# Patient Record
Sex: Female | Born: 1970 | Race: Black or African American | Hispanic: No | Marital: Married | Smoking: Never smoker
Health system: Southern US, Community
[De-identification: ages and names within clinical notes are randomized; demographics above are authoritative.]

## PROBLEM LIST (undated history)

## (undated) DIAGNOSIS — G43909 Migraine, unspecified, not intractable, without status migrainosus: Secondary | ICD-10-CM

## (undated) DIAGNOSIS — I10 Essential (primary) hypertension: Secondary | ICD-10-CM

---

## 1998-02-13 ENCOUNTER — Other Ambulatory Visit: Admission: RE | Admit: 1998-02-13 | Discharge: 1998-02-13 | Payer: Self-pay | Admitting: Obstetrics and Gynecology

## 1999-05-06 ENCOUNTER — Other Ambulatory Visit: Admission: RE | Admit: 1999-05-06 | Discharge: 1999-05-06 | Payer: Self-pay | Admitting: Obstetrics and Gynecology

## 2001-01-07 ENCOUNTER — Other Ambulatory Visit: Admission: RE | Admit: 2001-01-07 | Discharge: 2001-01-07 | Payer: Self-pay | Admitting: Obstetrics and Gynecology

## 2004-07-21 ENCOUNTER — Inpatient Hospital Stay (HOSPITAL_COMMUNITY): Admission: AD | Admit: 2004-07-21 | Discharge: 2004-08-29 | Payer: Self-pay | Admitting: Obstetrics and Gynecology

## 2004-07-23 ENCOUNTER — Encounter: Payer: Self-pay | Admitting: Obstetrics and Gynecology

## 2004-09-12 ENCOUNTER — Observation Stay (HOSPITAL_COMMUNITY): Admission: AD | Admit: 2004-09-12 | Discharge: 2004-09-13 | Payer: Self-pay | Admitting: Obstetrics and Gynecology

## 2004-09-15 ENCOUNTER — Inpatient Hospital Stay (HOSPITAL_COMMUNITY): Admission: AD | Admit: 2004-09-15 | Discharge: 2004-09-17 | Payer: Self-pay | Admitting: Obstetrics and Gynecology

## 2004-10-27 ENCOUNTER — Other Ambulatory Visit: Admission: RE | Admit: 2004-10-27 | Discharge: 2004-10-27 | Payer: Self-pay | Admitting: Obstetrics and Gynecology

## 2006-05-05 ENCOUNTER — Other Ambulatory Visit: Admission: RE | Admit: 2006-05-05 | Discharge: 2006-05-05 | Payer: Self-pay | Admitting: Obstetrics and Gynecology

## 2006-11-15 IMAGING — US US OB FOLLOW-UP
1 series · 13 of 28 positions shown · non-contrast
Comparison: none

CLINICAL DATA: Preterm labor with gestational diabetes.  Assess estimated fetal weight and amniotic fluid volume.

[Series 1: us ob follow-up · 0.35mm/px · 13 of 42 slices shown]
[im 2/42]
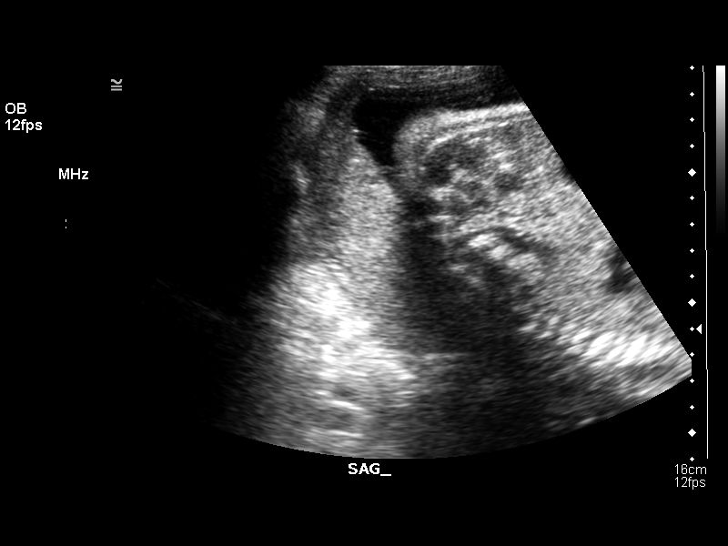
[im 5/42]
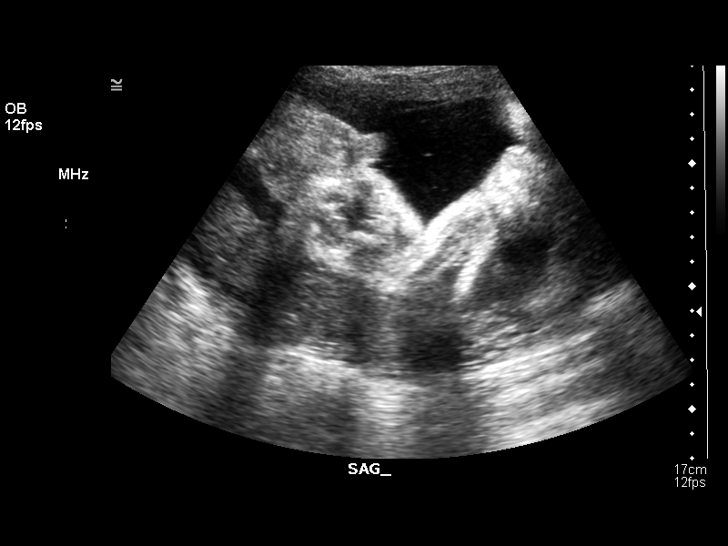
[im 8/42]
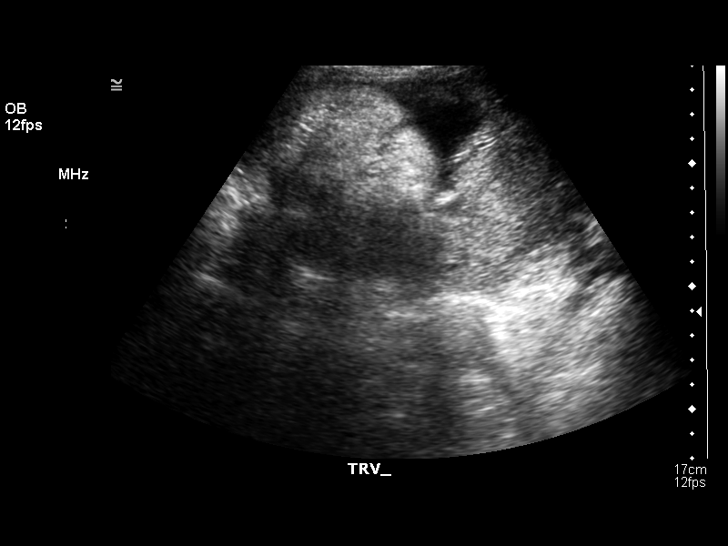
[im 11/42]
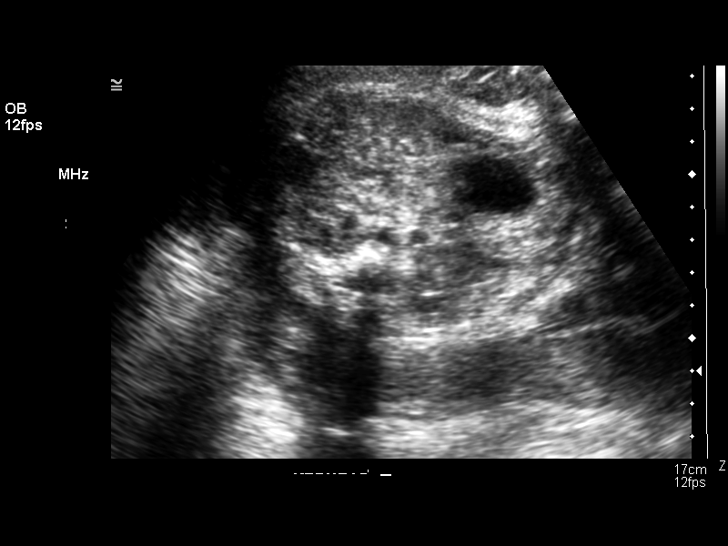
[im 14/42]
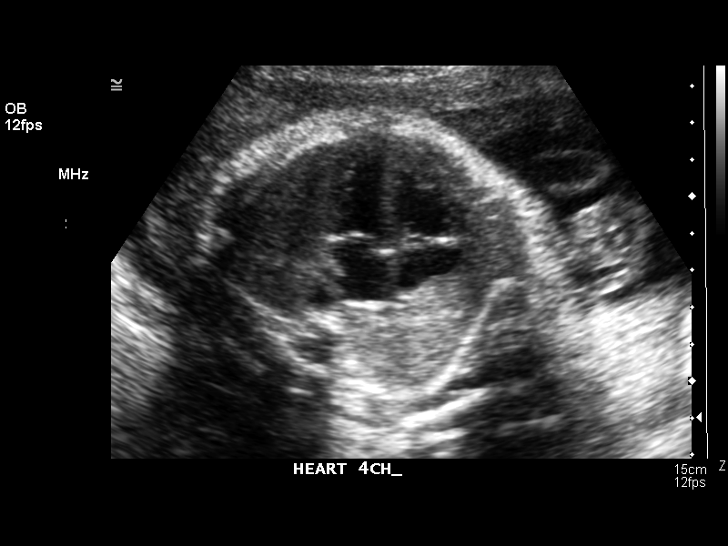
[im 17/42]
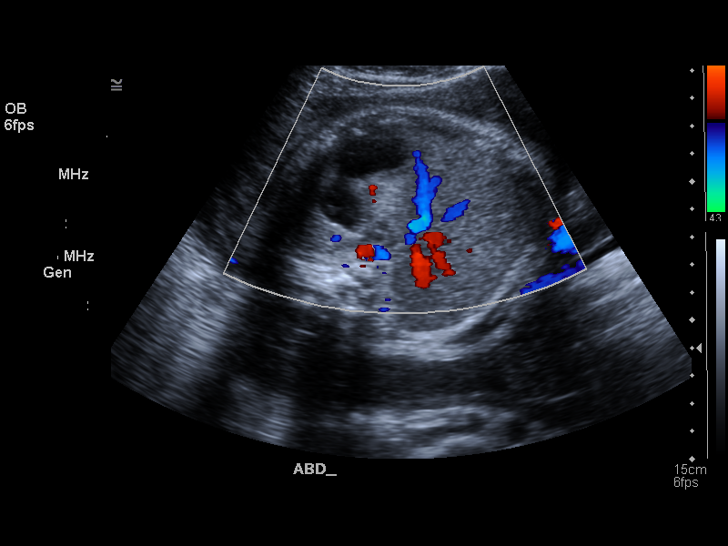
[im 22/42]
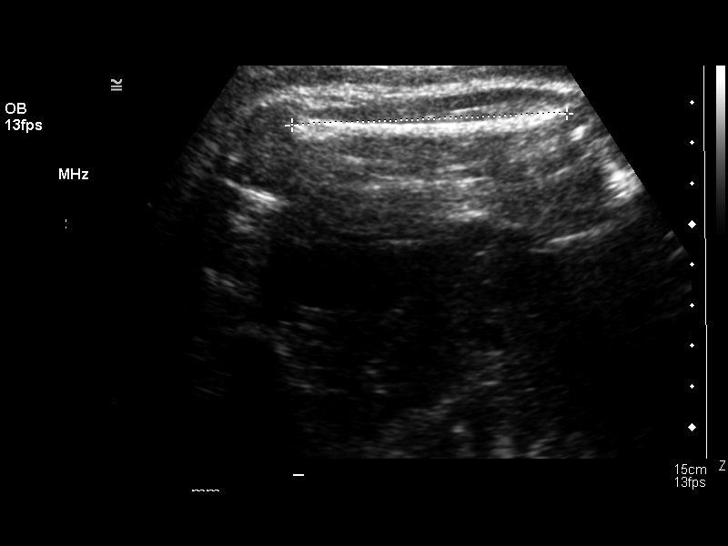
[im 25/42]
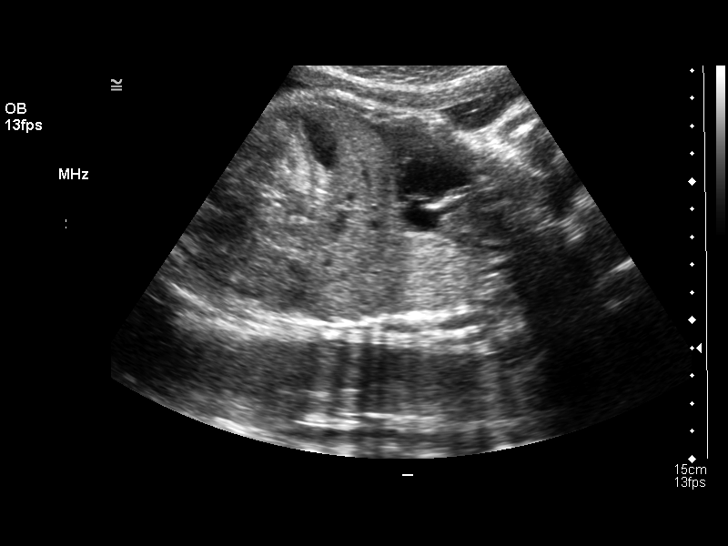
[im 28/42]
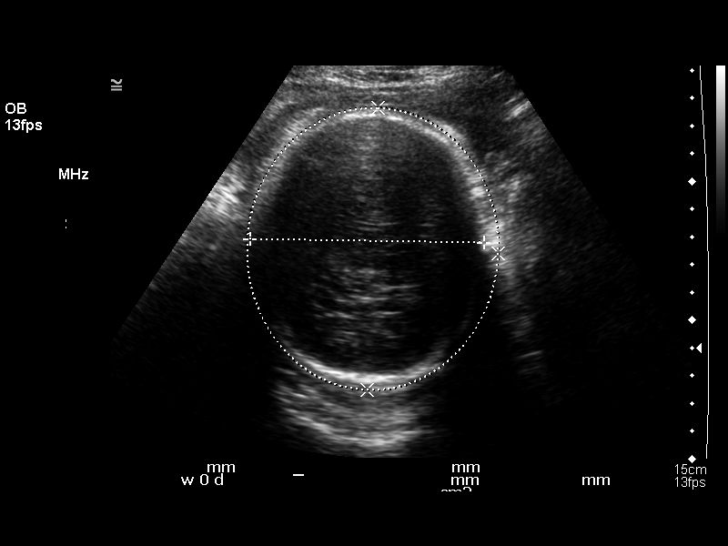
[im 31/42]
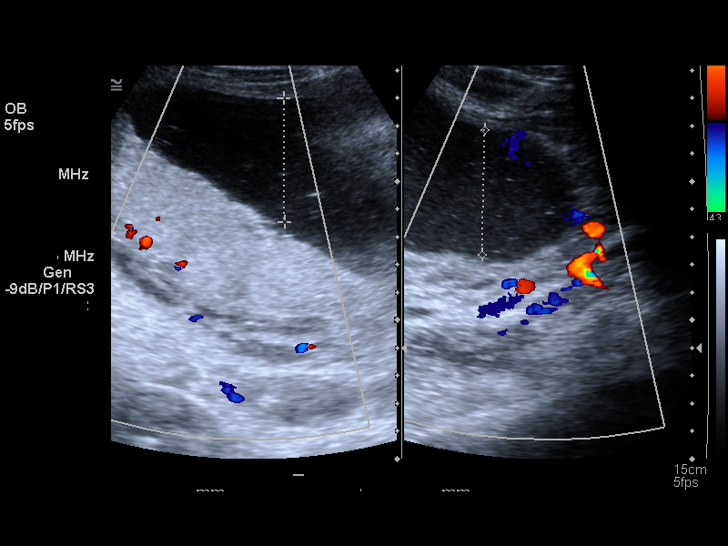
[im 34/42]
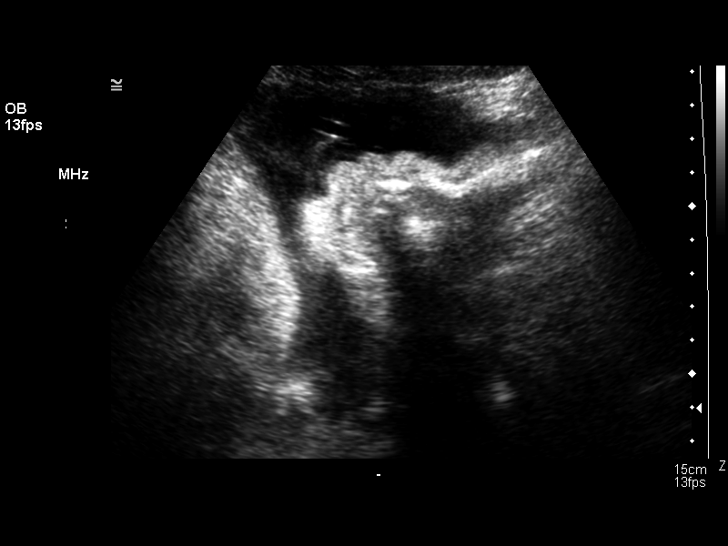
[im 37/42]
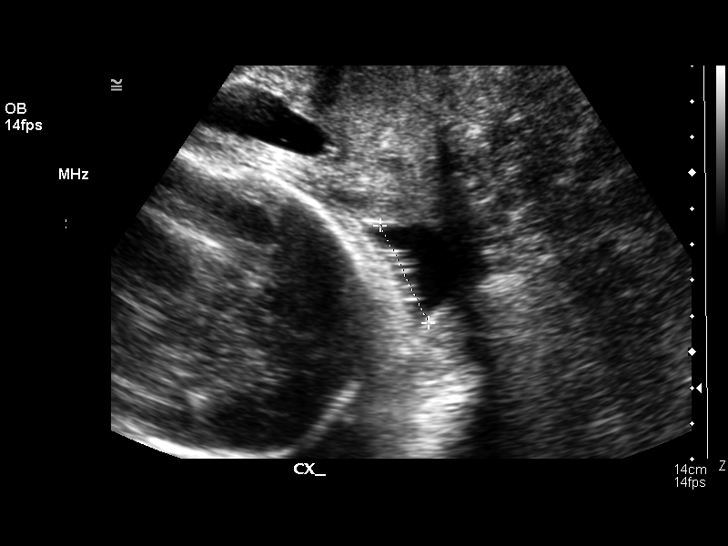
[im 40/42]
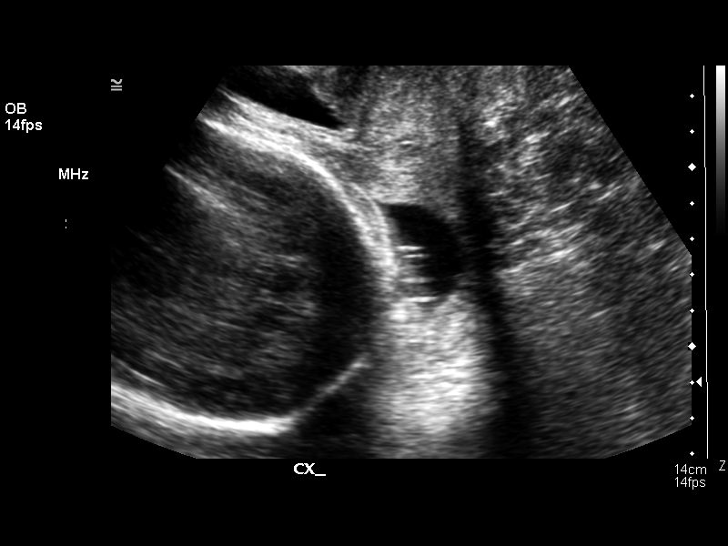

[13 of 28 positions shown; findings below may reference images not displayed]

OBSTETRICAL ULTRASOUND RE-EVALUATION:
Number of Fetuses:  1
Heart Rate:  153
Movement:  Yes
Breathing:  No
Presentation:  Cephalic
Placental Location:  Posterior
Grade:    I
Previa:  No
Amniotic Fluid (subjective):  Normal
Amniotic Fluid (objective):  15.6 cm AFI (5th -95th%ile = 8.1 ? 24.8 cm for 34 wks)

FETAL BIOMETRY
BPD:  8.4 cm   33 w 5 d
HC:  29.9 cm   33 w 1 d
AC:  30.0 cm   34 w 0 d
FL:  6.8 cm   34 w 5 d

Mean GA:  33 w 6 d
Assigned GA:  34 w 0 d

EFW:  3033 g (H) 50th ? 75th%ile (7317 ? 6161 g) For 34 wks
BPD/OFD: .88 (0.70 ? 0.86); FL/BPD: .81 (0.71 ? 0.87); FL/AC: .23 (0.20 ? 0.24); HC/AC: 1.00 (0.96 ? 1.11)

FETAL ANATOMY
Lateral Ventricles:  Not visualized 
Thalami/CSP:    Not visualized 
Posterior Fossa:  Not visualized 
Nuchal Region:  N/A
Spine:  Not visualized 
4 Chamber Heart on Left:  Visualized 
Stomach on Left:  Visualized 
3 Vessel Cord:  Visualized 
Cord Insertion Site:  Not visualized 
Kidneys:  Visualized 
Bladder:  Visualized 
Extremities:  Not visualized 

Evaluation limited by:  Fetal position and advanced gestational age 

  MATERNAL UTERINE AND ADNEXAL FINDINGS
Cervix:  0 cm Translabially
Dilatation is noted measuring 3 cm in width.
IMPRESSION: 1.  Single intrauterine pregnancy demonstrating an estimated gestational age by ultrasound of 33 weeks and 6 days.  Correlation with assigned gestational age of 34 weeks and 0 days suggests appropriate growth.  Currently the estimated fetal weight is between the 50th and 75th percentile for a 34 week gestation and this is a stable estimated fetal weight percentile since the previous exam suggesting appropriate interval growth.
2.   Subjectively and quantitatively normal amniotic fluid volume.
3.  No measurable cervix is identified with dilatation measuring 3 cm.
4.  No late developing fetal anatomic abnormalities are identified associated with the four chamber heart, stomach, kidneys or bladder.  The lateral ventricles could not be seen with confidence due to positioning on today?s exam.

## 2014-11-30 ENCOUNTER — Emergency Department (HOSPITAL_BASED_OUTPATIENT_CLINIC_OR_DEPARTMENT_OTHER)
Admission: EM | Admit: 2014-11-30 | Discharge: 2014-11-30 | Disposition: A | Discharge status: Home / Self Care | Payer: Federal, State, Local not specified - PPO | Attending: Emergency Medicine | Admitting: Emergency Medicine

## 2014-11-30 ENCOUNTER — Encounter (HOSPITAL_BASED_OUTPATIENT_CLINIC_OR_DEPARTMENT_OTHER): Payer: Self-pay | Admitting: Emergency Medicine

## 2014-11-30 ENCOUNTER — Emergency Department (HOSPITAL_BASED_OUTPATIENT_CLINIC_OR_DEPARTMENT_OTHER): Payer: Federal, State, Local not specified - PPO

## 2014-11-30 DIAGNOSIS — Z79899 Other long term (current) drug therapy: Secondary | ICD-10-CM | POA: Insufficient documentation

## 2014-11-30 DIAGNOSIS — G43909 Migraine, unspecified, not intractable, without status migrainosus: Secondary | ICD-10-CM | POA: Insufficient documentation

## 2014-11-30 DIAGNOSIS — M542 Cervicalgia: Secondary | ICD-10-CM | POA: Insufficient documentation

## 2014-11-30 DIAGNOSIS — R51 Headache: Secondary | ICD-10-CM | POA: Diagnosis present

## 2014-11-30 DIAGNOSIS — I10 Essential (primary) hypertension: Secondary | ICD-10-CM | POA: Insufficient documentation

## 2014-11-30 DIAGNOSIS — Z880 Allergy status to penicillin: Secondary | ICD-10-CM | POA: Insufficient documentation

## 2014-11-30 DIAGNOSIS — A879 Viral meningitis, unspecified: Secondary | ICD-10-CM | POA: Diagnosis not present

## 2014-11-30 DIAGNOSIS — R519 Headache, unspecified: Secondary | ICD-10-CM

## 2014-11-30 HISTORY — DX: Essential (primary) hypertension: I10

## 2014-11-30 HISTORY — DX: Migraine, unspecified, not intractable, without status migrainosus: G43.909

## 2014-11-30 LAB — CSF CELL COUNT WITH DIFFERENTIAL
EOS CSF: 0 % (ref 0–1)
Eosinophils, CSF: 0 % (ref 0–1)
Lymphs, CSF: 84 % — ABNORMAL HIGH (ref 40–80)
Lymphs, CSF: 93 % — ABNORMAL HIGH (ref 40–80)
MONOCYTE-MACROPHAGE-SPINAL FLUID: 4 % — AB (ref 15–45)
Monocyte-Macrophage-Spinal Fluid: 1 % — ABNORMAL LOW (ref 15–45)
RBC COUNT CSF: 69 /mm3 — AB
RBC Count, CSF: 56 /mm3 — ABNORMAL HIGH
SEGMENTED NEUTROPHILS-CSF: 6 % (ref 0–6)
Segmented Neutrophils-CSF: 12 % — ABNORMAL HIGH (ref 0–6)
TUBE #: 4
Tube #: 1
WBC CSF: 200 /mm3 — AB (ref 0–5)
WBC, CSF: 284 /mm3 (ref 0–5)

## 2014-11-30 LAB — COMPREHENSIVE METABOLIC PANEL
ALT: 15 U/L (ref 14–54)
ANION GAP: 10 (ref 5–15)
AST: 17 U/L (ref 15–41)
Albumin: 3.9 g/dL (ref 3.5–5.0)
Alkaline Phosphatase: 34 U/L — ABNORMAL LOW (ref 38–126)
BUN: 9 mg/dL (ref 6–20)
CALCIUM: 9.3 mg/dL (ref 8.9–10.3)
CO2: 26 mmol/L (ref 22–32)
CREATININE: 0.97 mg/dL (ref 0.44–1.00)
Chloride: 101 mmol/L (ref 101–111)
GLUCOSE: 82 mg/dL (ref 65–99)
Potassium: 4 mmol/L (ref 3.5–5.1)
Sodium: 137 mmol/L (ref 135–145)
Total Bilirubin: 0.4 mg/dL (ref 0.3–1.2)
Total Protein: 7.7 g/dL (ref 6.5–8.1)

## 2014-11-30 LAB — CBC WITH DIFFERENTIAL/PLATELET
Basophils Absolute: 0 10*3/uL (ref 0.0–0.1)
Basophils Relative: 0 % (ref 0–1)
EOS PCT: 0 % (ref 0–5)
Eosinophils Absolute: 0 10*3/uL (ref 0.0–0.7)
HEMATOCRIT: 35.2 % — AB (ref 36.0–46.0)
Hemoglobin: 12 g/dL (ref 12.0–15.0)
LYMPHS ABS: 1.6 10*3/uL (ref 0.7–4.0)
Lymphocytes Relative: 29 % (ref 12–46)
MCH: 30.1 pg (ref 26.0–34.0)
MCHC: 34.1 g/dL (ref 30.0–36.0)
MCV: 88.2 fL (ref 78.0–100.0)
MONO ABS: 1 10*3/uL (ref 0.1–1.0)
Monocytes Relative: 18 % — ABNORMAL HIGH (ref 3–12)
Neutro Abs: 2.8 10*3/uL (ref 1.7–7.7)
Neutrophils Relative %: 53 % (ref 43–77)
PLATELETS: 278 10*3/uL (ref 150–400)
RBC: 3.99 MIL/uL (ref 3.87–5.11)
RDW: 12.8 % (ref 11.5–15.5)
WBC: 5.3 10*3/uL (ref 4.0–10.5)

## 2014-11-30 LAB — PROTEIN, CSF: TOTAL PROTEIN, CSF: 58 mg/dL — AB (ref 15–45)

## 2014-11-30 LAB — GLUCOSE, CSF: GLUCOSE CSF: 51 mg/dL (ref 40–70)

## 2014-11-30 MED ORDER — DIPHENHYDRAMINE HCL 50 MG/ML IJ SOLN
25.0000 mg | Freq: Once | INTRAMUSCULAR | Status: AC
  Administered 2014-11-30: 25 mg via INTRAVENOUS
  Filled 2014-11-30: qty 1

## 2014-11-30 MED ORDER — DEXAMETHASONE SODIUM PHOSPHATE 10 MG/ML IJ SOLN
10.0000 mg | Freq: Once | INTRAMUSCULAR | Status: AC
  Administered 2014-11-30: 10 mg via INTRAVENOUS
  Filled 2014-11-30: qty 1

## 2014-11-30 MED ORDER — SODIUM CHLORIDE 0.9 % IV BOLUS (SEPSIS)
1000.0000 mL | Freq: Once | INTRAVENOUS | Status: AC
  Administered 2014-11-30: 1000 mL via INTRAVENOUS

## 2014-11-30 MED ORDER — METOCLOPRAMIDE HCL 5 MG/ML IJ SOLN
10.0000 mg | Freq: Once | INTRAMUSCULAR | Status: AC
  Administered 2014-11-30: 10 mg via INTRAVENOUS
  Filled 2014-11-30: qty 2

## 2014-11-30 NOTE — ED Notes (Signed)
MD at bedside. 

## 2014-11-30 NOTE — ED Notes (Signed)
Pt placed on droplet precautions

## 2014-11-30 NOTE — ED Provider Notes (Signed)
CSN: [id number]     Arrival date & time 11/30/14  1220 History   First MD Initiated Contact with Patient 11/30/14 1240     Chief Complaint  Patient presents with  . Headache     (Consider location/radiation/quality/duration/timing/severity/associated sxs/prior Treatment) HPI Comments: Patient currently resides in [place] and a proximally 6 days ago developed a severe headache in a bandlike distribution on her head. She does have a history of migraine headaches but states she's never had a headache like this. Typically her migraines are associated with photophobia, nausea vomiting and of course no longer than 2-5 hours. With this headache she has no photophobia some mild nausea but no vomiting and has noted a fever. 3 days ago she had a temperature of 100.7 and last night it was 100. She denies any rashes, focal weakness, diarrhea, abdominal pain, sore throat, rhinorrhea or cough. She was seen in emergency department and [place] prior to coming down here and was given Fioricet at that time which is improved her headache from a 9 to a 7 but she continues to have a headache every day and came today for further evaluation. She is currently on her menstrual period. She denies any urinary symptoms. She takes no birth control or hormone pills. No head trauma.  Patient is a 44 y.o. female presenting with headaches. The history is provided by the patient.  Headache Pain location:  Frontal Quality:  Dull Radiates to:  Does not radiate Severity currently:  6/10 Severity at highest:  10/10 Onset quality:  Gradual Duration:  6 days Timing:  Constant Progression:  Improving Chronicity:  New Similar to prior headaches: no   Context comment:  Started spontaneously does not seem to be associated with anything in particular. Relieved by:  Nothing Worsened by:  Nothing Ineffective treatments: Was seen in the emergency room earlier this week and given QRS at which helps the pain some. Associated  symptoms: fever, myalgias, nausea and neck stiffness   Associated symptoms: no abdominal pain, no blurred vision, no congestion, no cough, no photophobia, no visual change and no vomiting   Risk factors comment:  Her child had a headache and not feeling well last week prior to her developing symptoms.   Past Medical History  Diagnosis Date  . Hypertension   . Migraine    History reviewed. No pertinent past surgical history. History reviewed. No pertinent family history. History  Substance Use Topics  . Smoking status: Never Smoker   . Smokeless tobacco: Never Used  . Alcohol Use: Yes     Comment: occasional   OB History    No data available     Review of Systems  Constitutional: Positive for fever.  HENT: Negative for congestion.   Eyes: Negative for blurred vision and photophobia.  Respiratory: Negative for cough.   Gastrointestinal: Positive for nausea. Negative for vomiting and abdominal pain.  Musculoskeletal: Positive for myalgias and neck stiffness.  Neurological: Positive for headaches.  All other systems reviewed and are negative.     Allergies  Penicillins  Home Medications   Prior to Admission medications   Medication Sig Start Date End Date Taking? Authorizing Provider  amLODipine (NORVASC) 10 MG tablet Take 10 mg by mouth daily.   Yes Historical Provider, MD  butalbital-acetaminophen-caffeine (FIORICET, ESGIC) 50-325-40 MG per tablet Take by mouth 2 (two) times daily as needed for headache.   Yes Historical Provider, MD   BP 127/101 mmHg  Pulse 76  Temp(Src) 99 F (37.2 C) (  Oral)  Resp 18  SpO2 98%  LMP 11/29/2014 Physical Exam  Constitutional: She is oriented to person, place, and time. She appears well-developed and well-nourished. No distress.  HENT:  Head: Normocephalic and atraumatic.  Right Ear: Tympanic membrane and ear canal normal.  Left Ear: Tympanic membrane and ear canal normal.  Eyes: EOM are normal. Pupils are equal, round, and  reactive to light.  Fundoscopic exam:      The right eye shows no papilledema.       The left eye shows no papilledema.  Neck: Normal range of motion. Neck supple. Muscular tenderness present. No spinous process tenderness present. Normal range of motion present. No Brudzinski's sign and no Kernig's sign noted.    Mild tenderness to the muscles of the neck but full range of motion of the neck without any meningeal signs  Cardiovascular: Normal rate, regular rhythm, normal heart sounds and intact distal pulses.  Exam reveals no friction rub.   No murmur heard. Pulmonary/Chest: Effort normal and breath sounds normal. She has no wheezes. She has no rales.  Abdominal: Soft. Bowel sounds are normal. She exhibits no distension. There is no tenderness. There is no rebound and no guarding.  Musculoskeletal: Normal range of motion. She exhibits no tenderness.  No edema  Lymphadenopathy:    She has no cervical adenopathy.  Neurological: She is alert and oriented to person, place, and time. She has normal strength. No cranial nerve deficit or sensory deficit. Coordination and gait normal.  No photophobia  Skin: Skin is warm and dry. No rash noted.  Psychiatric: She has a normal mood and affect. Her behavior is normal.  Nursing note and vitals reviewed.   ED Course  LUMBAR PUNCTURE Date/Time: 11/30/2014 2:17 PM Performed by: Gwyneth Sprout Authorized by: Gwyneth Sprout Consent: Verbal consent obtained. Risks and benefits: risks, benefits and alternatives were discussed Consent given by: patient Patient understanding: patient states understanding of the procedure being performed Relevant documents: relevant documents present and verified Test results: test results available and properly labeled Site marked: the operative site was marked Imaging studies: imaging studies available Patient identity confirmed: verbally with patient Time out: Immediately prior to procedure a "time out" was  called to verify the correct patient, procedure, equipment, support staff and site/side marked as required. Indications: evaluation for infection Anesthesia: local infiltration Local anesthetic: lidocaine 2% without epinephrine Anesthetic total: 4 ml Patient sedated: no Preparation: Patient was prepped and draped in the usual sterile fashion. Lumbar space: L4-L5 interspace Patient's position: sitting Needle gauge: 22 Needle type: diamond point Needle length: 5.0 in Number of attempts: 1 Fluid appearance: clear Tubes of fluid: 4 Total volume: 1 ml Post-procedure: site cleaned and adhesive bandage applied Patient tolerance: Patient tolerated the procedure well with no immediate complications   (including critical care time) Labs Review Labs Reviewed  CBC WITH DIFFERENTIAL/PLATELET - Abnormal; Notable for the following:    HCT 35.2 (*)    Monocytes Relative 18 (*)    All other components within normal limits  COMPREHENSIVE METABOLIC PANEL - Abnormal; Notable for the following:    Alkaline Phosphatase 34 (*)    All other components within normal limits  CSF CULTURE  GLUCOSE, CSF  PROTEIN, CSF  CSF CELL COUNT WITH DIFFERENTIAL  CSF CELL COUNT WITH DIFFERENTIAL    Imaging Review Ct Head Wo Contrast  11/30/2014   CLINICAL DATA:  Headache for 6 days  EXAM: CT HEAD WITHOUT CONTRAST  TECHNIQUE: Contiguous axial images were obtained from the  base of the skull through the vertex without intravenous contrast.  COMPARISON:  None.  FINDINGS: No acute cortical infarct, hemorrhage, or mass lesion ispresent. Ventricles are of normal size. No significant extra-axial fluid collection is present. The paranasal sinuses andmastoid air cells are clear. The osseous skull is intact.  IMPRESSION: Negative exam.   Electronically Signed   By: Signa Kell M.D.   On: 11/30/2014 13:45     EKG Interpretation None      MDM   Final diagnoses:  Headache   patient with a persistent headache for the  last 6 days, generalized body aches, fever and improving neck discomfort. Patient was seen in the emergency room in [place] where she lives 5 days ago and at that time was given Fioricet which she has been using with some improvement of headache but she is taking it every 6 hours.  Due to persistent temperatures between 100.7 200 and ongoing headache that is not characteristic of her typical migraines she came here for further evaluation. She denies any unilateral weakness, swallowing difficulty, aphasia, photophobia or vomiting. She has had nausea and decreased appetite. She has been taking Tylenol regularly. She denies any known tick exposure. Her child last week prior to her symptoms starting had an illness that lasted 2 days where he complained of headache and not feeling well. He is now fine. Another child with hand-foot-and-mouth but she denies any sore throat or rash.  On exam patient's neuro exam is within normal limits. No meningismus at this time. Normal vital signs.  Given history concerned that patient may have viral meningitis. Low suspicion for bacterial meningitis at this time. Low suspicion for [facility] or cavernous venous thrombosis.    Patient given headache cocktail. CBC, CMP, head CT pending area feel patient will need an LP to further evaluate for meningitis CT of the head is normal.  CBC and CMP wnl.  LP performed with clear fluid and no complications.  After headache cocktail pain down to a 2.  CSF results pending.  Gwyneth Sprout, MD 12/02/14 [phone]

## 2014-11-30 NOTE — ED Notes (Addendum)
Pt reports 6 days of ha, nausea and hot/cold spells with body aches.  Went to provider, given fioricet, relieves temporarily but comes back approx every 6 hours per pt.  Reports has had some constipation and yesterday finally had bm.  Concerned for dx other than migraine since has been continuing so long.  Tearful during assessment.  Warm blanket offered and lights dimmed when leaving room.

## 2014-11-30 NOTE — ED Notes (Signed)
Pt in c/o headache x 6 days. Was seen at ED in [place] and given prescription that helps, but does not eliminate the pain. Pt is neurologically intact with no facial droop, unilateral weakness, aphasia or dysarthria. Pt has hx of migraine but this is different, pt is tearful and stating she is concerned this may indicate a larger problem.

## 2014-11-30 NOTE — ED Notes (Signed)
Patient transported to CT via stretcher per tech. 

## 2014-11-30 NOTE — ED Provider Notes (Signed)
Patient visit shared.  Pt here for HA, atypical for her migraines, LP results pending at time of transfer of care.  On repeat eval patient's pain is completely resolved.  She is nontoxic appearing.  CSF analysis with leukocytosis with lymphocytic predominance c/w viral meningitis.  Discussed home care and return precautions.    Tilden Fossa, MD 11/30/14 [phone]

## 2014-11-30 NOTE — Discharge Instructions (Signed)
Viral Meningitis Meningitis is an inflammation of the fluid and membranes surrounding the spinal cord and brain. Viral meningitis is caused by a viral infection. It is important to know whether a virus or bacterium is causing your meningitis. Viral meningitis is generally less severe than bacterial meningitis. Aseptic meningitis is any meningitis not caused by a bacterial infection, including viral meningitis. Meningitis can also be caused by fungi, parasites, certain medicines, cancer, and autoimmune disorders. Uncomplicated meningitis usually resolves on its own in 7 to 10 days.However, there can be complicated cases that last much longer. People with lowered immune systems are at greater risk for poor outcomes from meningitis.It is important to get treatment as soon as possible to minimize the impact of a meningitis infection. Long-term complications could include seizures, hearing loss, weakness, paralysis, blindness, or cognitive impairment. CAUSES Most cases of meningitis are due to viruses. Viruses that cause meningitis include enteroviruses (such as polio and coxsackie viruses), herpes simplex virus, varicella zoster virus, mumps, and HIV. SYMPTOMS  Symptoms can develop over many hours. They may even take a few days to develop. Common symptoms of meningitis in people over the age of 2 years include:  High fever.  Headache.  Stiff neck.  Irritability.  Nausea and vomiting.  Fatigue.  Discomfort from exposure to light.  Discomfort from exposure to loud noise.  Trouble walking.  Altered mental status.  Seizures. DIAGNOSIS  Early diagnosis and treatment are very important. If you have symptoms, you should see your caregiver right away. Your evaluation may include lab tests of your blood and spinal fluid. A spinal fluid sample is taken through a procedure called a lumbar puncture or spinal tap. During the procedure, a needle is inserted into an area in the lower back. You may also  have a CT scan of your brain as part of your evaluation.If there is suspicion of an infection or inflammation of the brain (encephalitis), an MRI scan may be done. TREATMENT   Antibiotics are not effective against a virus. Antiviral medicines may be used depending on the cause of your meningitis.  Treatment for viral meningitis focuses on reducing your symptoms. This may include rest, hydration, and medicines to reduce fever and pain.  Steroids may also be used if there is significant swelling of the brain. PREVENTION  Vaccines can help prevent meningitis caused by polio, measles, and mumps.  Strict hand washing is effective in controlling the spread of many causes of meningitis.  Using barrier protection during sexual intercourse can prevent the spread of meningitis caused by viruses such as the herpes virus or HIV.  Protection from mosquitoes, especially for the very young and very old, can prevent some causes of meningitis. HOME CARE INSTRUCTIONS  Rest.  Drink enough water and fluids to keep your urine clear or pale yellow.  Take all medicine as prescribed.  Practice good hygiene to prevent others from getting sick.  Follow up with your caregiver as directed. SEEK IMMEDIATE MEDICAL CARE IF:  You develop dizziness.  You have a very fast heartbeat.  You have difficulty breathing.  You develop confusion.  You have seizures.  You have unstoppable nausea and vomiting.  You develop any worsening symptoms. MAKE SURE YOU:  Understand these instructions.  Will watch your condition.  Will get help right away if you are not doing well or get worse. Document Released: 05/15/2002 Document Revised: 08/17/2011 Document Reviewed: 09/09/2010 [employer] Patient Information 2015 [employer]. This information is not intended to replace advice given to you   by your health care provider. Make sure you discuss any questions you have with your health care provider.  

## 2014-11-30 NOTE — ED Notes (Signed)
MD back at bedside.

## 2014-12-01 NOTE — ED Notes (Signed)
Rec phone call from pt in regards to dx and treatment rec yesterday at this ED, EDP on duty currently states he will review pts chart and care given and call pt back to discuss plan of care and treatment received

## 2014-12-03 LAB — CSF CULTURE W GRAM STAIN
Culture: NO GROWTH
Special Requests: NORMAL

## 2014-12-03 LAB — CSF CULTURE

## 2014-12-04 LAB — PATHOLOGIST SMEAR REVIEW

## 2014-12-05 NOTE — ED Notes (Signed)
[place] ED Dr Candy SledgeEltaki called requesting HSV added on to spinal fluid that was done on 6/24 here. Order placed per Rancour MD. Lab aware and request sent to lab Sherri In lab aware. Results to be called to pt

## 2014-12-06 LAB — HERPES SIMPLEX VIRUS(HSV) DNA BY PCR
HSV 1 DNA: NEGATIVE
HSV 2 DNA: NEGATIVE

## 2017-02-16 IMAGING — CT CT HEAD W/O CM
1 series · 16 of 30 positions shown, 20 images · non-contrast
Comparison: None.

CLINICAL DATA: Headache for 6 days

EXAM:
CT HEAD WITHOUT CONTRAST
TECHNIQUE: Contiguous axial images were obtained from the base of the skull
through the vertex without intravenous contrast.

[Series 2: head 4.8 h37s · axial · 0.45mm/px · z∈[-510,-377]mm · 16 of 32 slices shown, 20 images]
[im 2/32  brain]
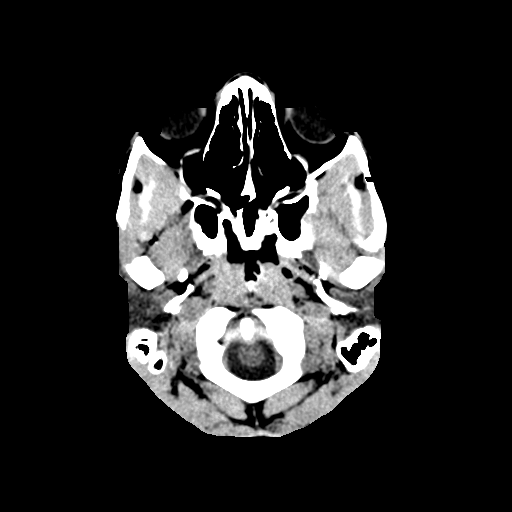
[im 2/32  bone]
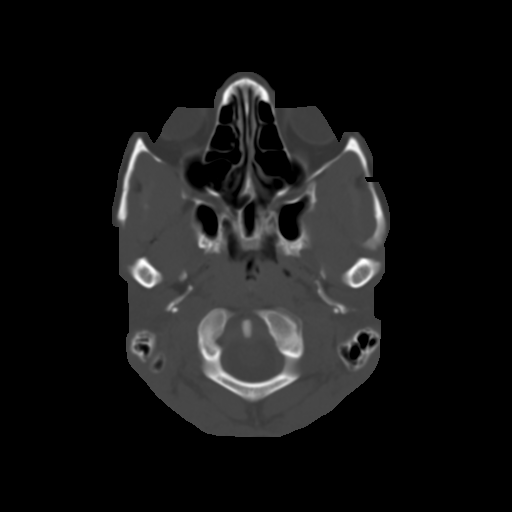
[im 4/32  brain]
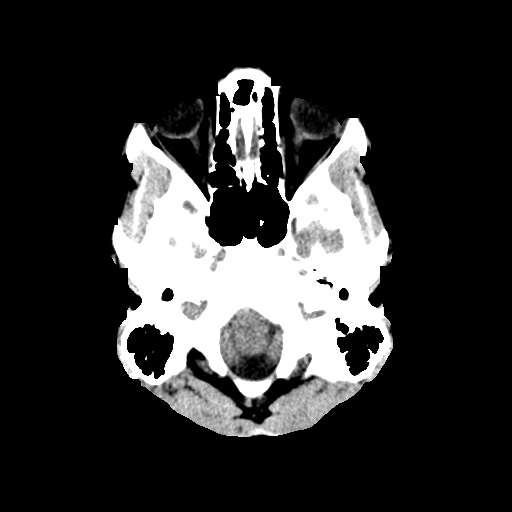
[im 6/32  brain]
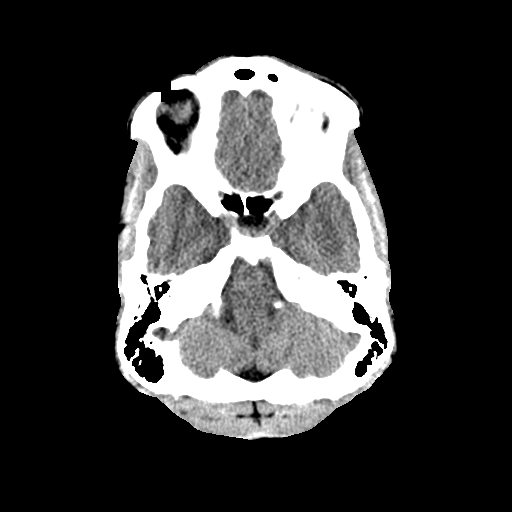
[im 8/32  brain]
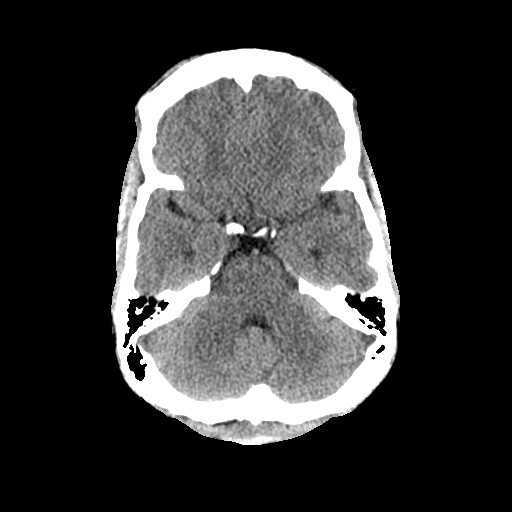
[im 9/32  brain]
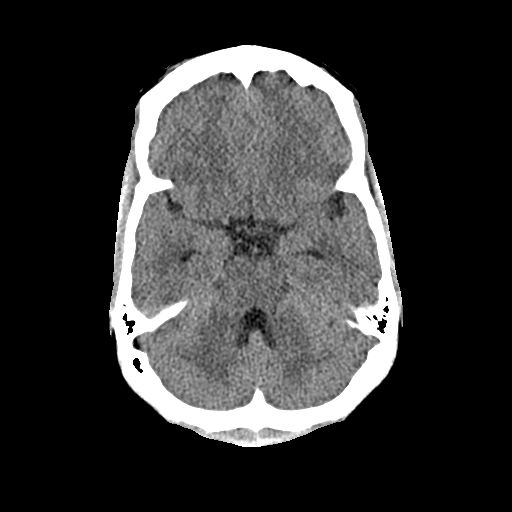
[im 9/32  bone]
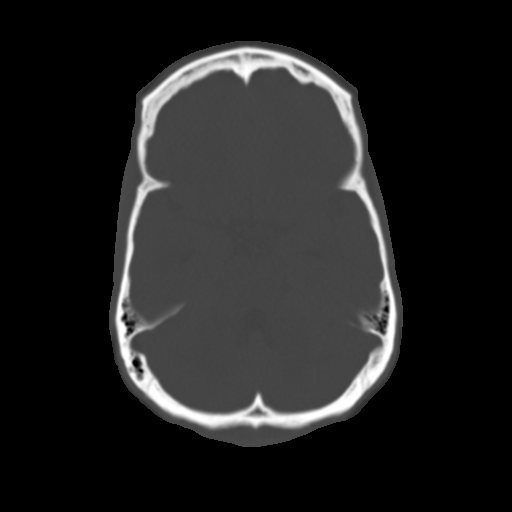
[im 11/32  brain]
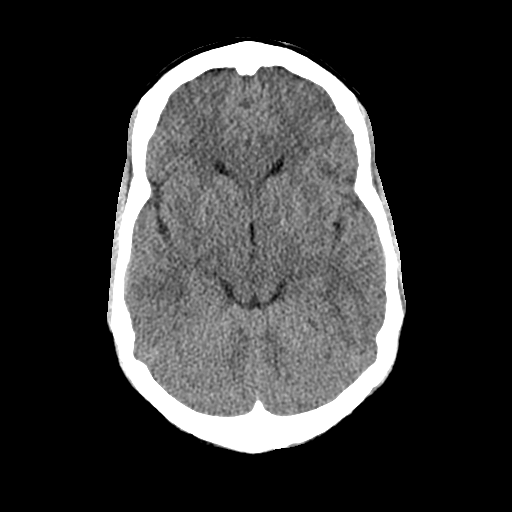
[im 13/32  brain]
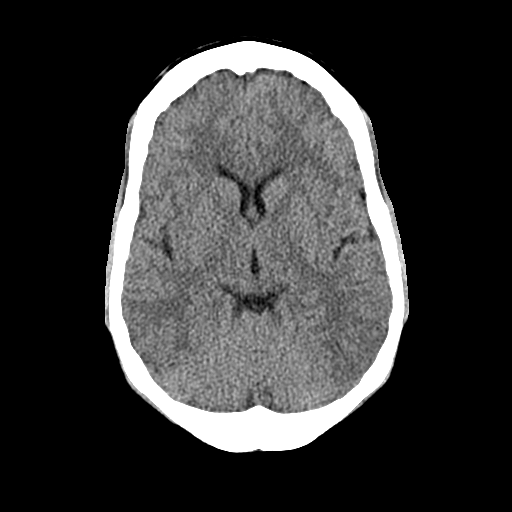
[im 15/32  brain]
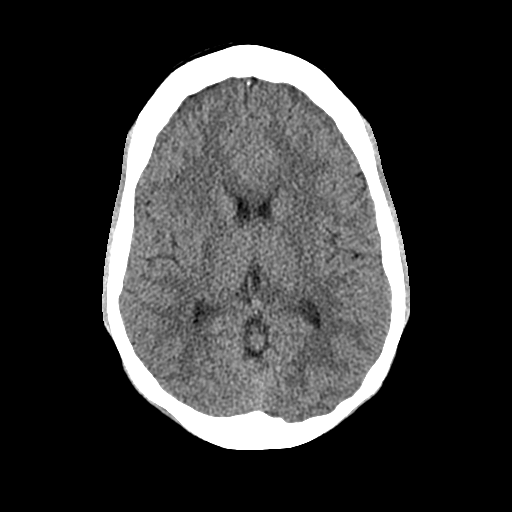
[im 17/32  brain]
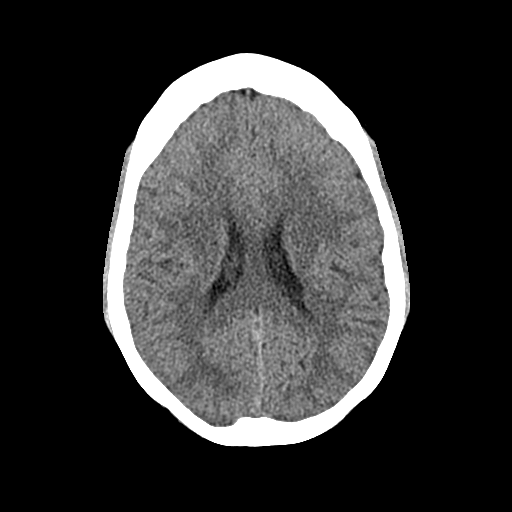
[im 17/32  bone]
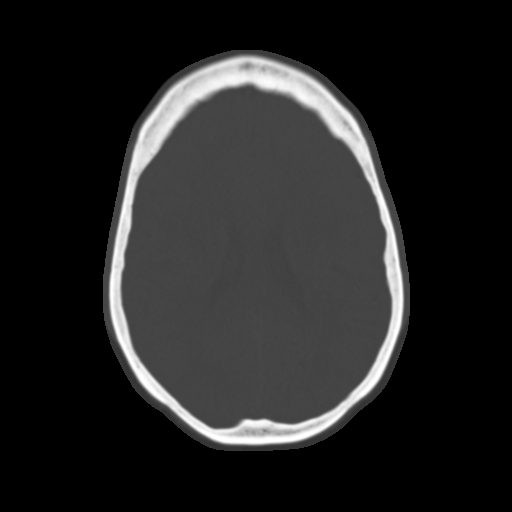
[im 19/32  brain]
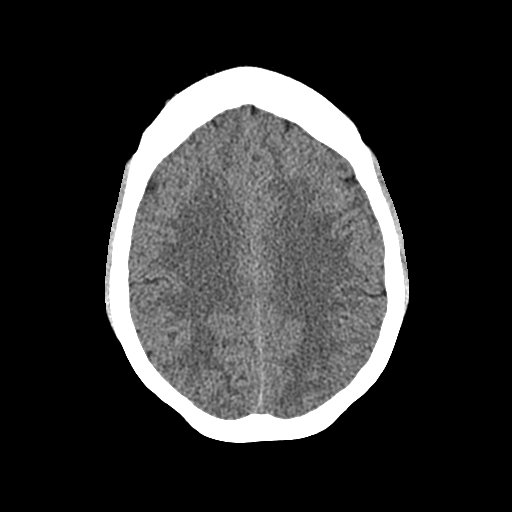
[im 21/32  brain]
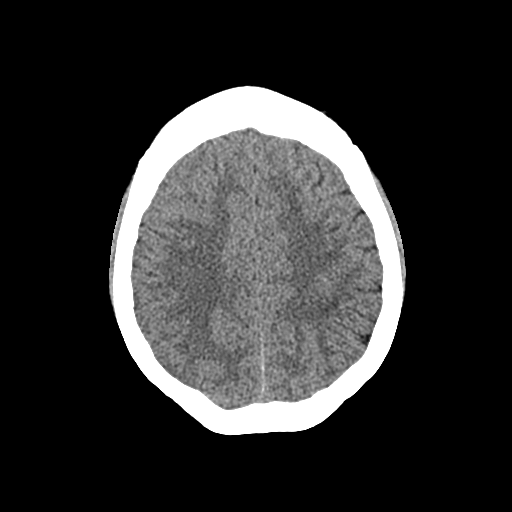
[im 23/32  brain]
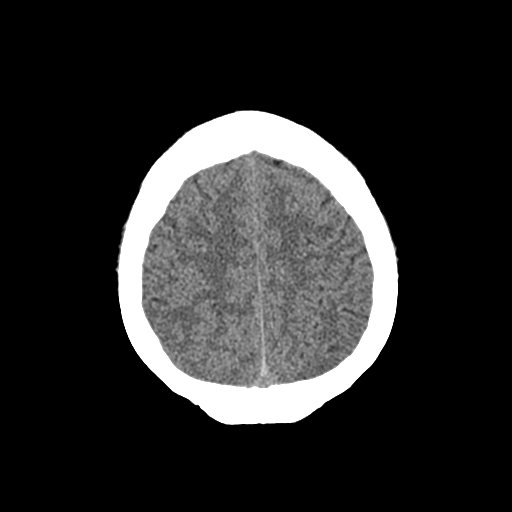
[im 24/32  brain]
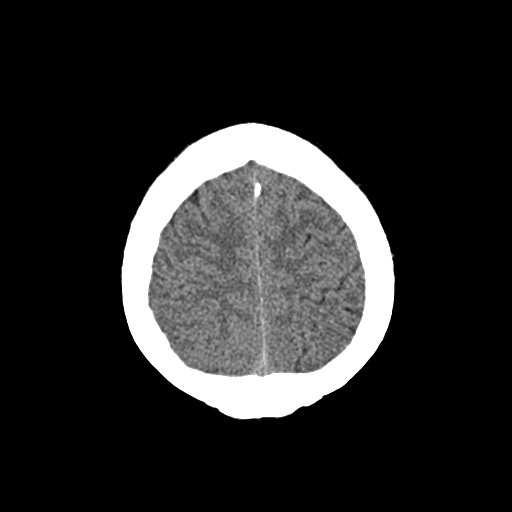
[im 24/32  bone]
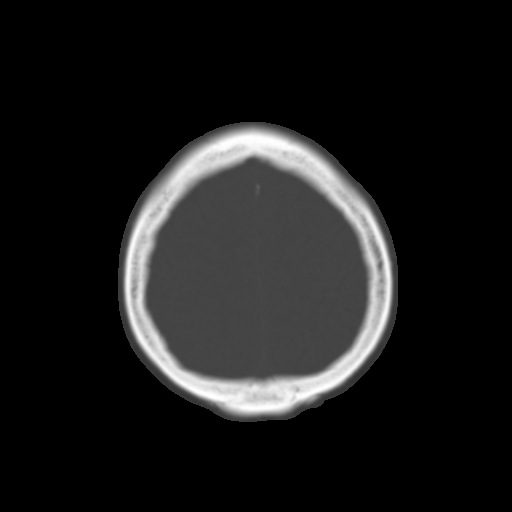
[im 26/32  brain]
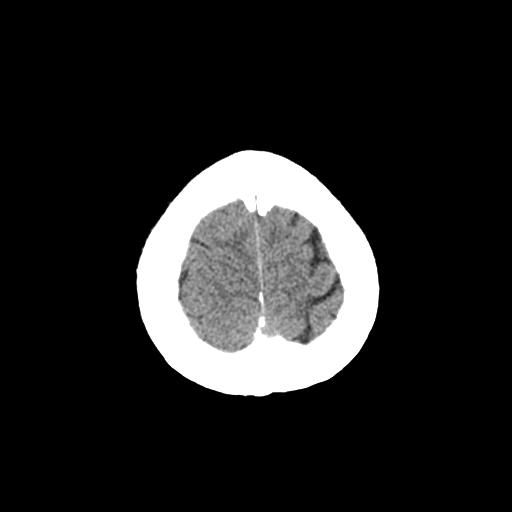
[im 28/32  brain]
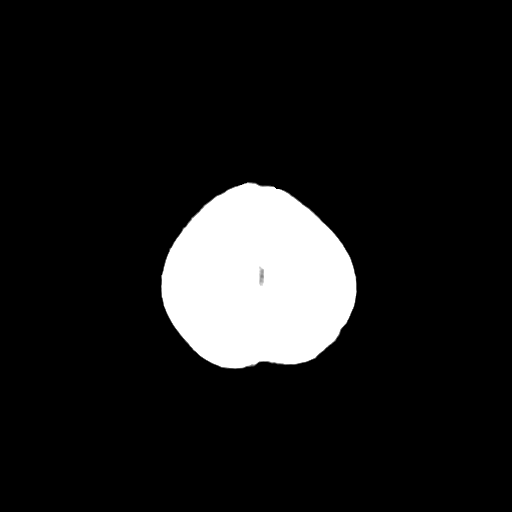
[im 30/32  brain]
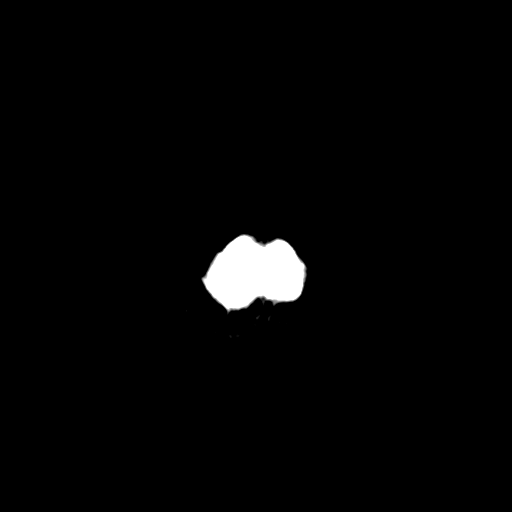

[16 of 30 positions shown; findings below may reference images not displayed]

FINDINGS: No acute cortical infarct, hemorrhage, or mass lesion ispresent.
Ventricles are of normal size. No significant extra-axial fluid
collection is present. The paranasal sinuses andmastoid air cells
are clear. The osseous skull is intact.
IMPRESSION: Negative exam.
